# Patient Record
Sex: Female | Born: 1955 | Race: White | Hispanic: No | Marital: Married | Smoking: Never smoker
Health system: Southern US, Community
[De-identification: ages and names within clinical notes are randomized; demographics above are authoritative.]

---

## 1999-10-26 ENCOUNTER — Encounter: Payer: Self-pay | Admitting: Family Medicine

## 1999-10-26 ENCOUNTER — Encounter: Admission: RE | Admit: 1999-10-26 | Discharge: 1999-10-26 | Payer: Self-pay | Admitting: Family Medicine

## 2000-12-23 ENCOUNTER — Encounter: Payer: Self-pay | Admitting: Internal Medicine

## 2000-12-23 ENCOUNTER — Encounter: Admission: RE | Admit: 2000-12-23 | Discharge: 2000-12-23 | Payer: Self-pay | Admitting: Internal Medicine

## 2002-01-15 ENCOUNTER — Encounter: Payer: Self-pay | Admitting: Internal Medicine

## 2002-01-15 ENCOUNTER — Encounter: Admission: RE | Admit: 2002-01-15 | Discharge: 2002-01-15 | Payer: Self-pay | Admitting: Internal Medicine

## 2003-04-21 ENCOUNTER — Encounter: Payer: Self-pay | Admitting: Internal Medicine

## 2003-04-21 ENCOUNTER — Encounter: Admission: RE | Admit: 2003-04-21 | Discharge: 2003-04-21 | Payer: Self-pay | Admitting: Internal Medicine

## 2005-05-09 ENCOUNTER — Other Ambulatory Visit: Admission: RE | Admit: 2005-05-09 | Discharge: 2005-05-09 | Payer: Self-pay | Admitting: Internal Medicine

## 2006-02-28 ENCOUNTER — Emergency Department (HOSPITAL_COMMUNITY): Admission: EM | Admit: 2006-02-28 | Discharge: 2006-02-28 | Payer: Self-pay | Admitting: Emergency Medicine

## 2007-07-02 ENCOUNTER — Other Ambulatory Visit: Admission: RE | Admit: 2007-07-02 | Discharge: 2007-07-02 | Payer: Self-pay | Admitting: Obstetrics and Gynecology

## 2007-12-10 ENCOUNTER — Encounter: Admission: RE | Admit: 2007-12-10 | Discharge: 2007-12-10 | Payer: Self-pay | Admitting: Obstetrics and Gynecology

## 2009-02-23 ENCOUNTER — Encounter: Admission: RE | Admit: 2009-02-23 | Discharge: 2009-02-23 | Payer: Self-pay | Admitting: Obstetrics and Gynecology

## 2009-02-23 ENCOUNTER — Other Ambulatory Visit: Admission: RE | Admit: 2009-02-23 | Discharge: 2009-02-23 | Payer: Self-pay | Admitting: Obstetrics and Gynecology

## 2010-07-18 ENCOUNTER — Encounter: Admission: RE | Admit: 2010-07-18 | Discharge: 2010-07-18 | Payer: Self-pay | Admitting: Internal Medicine

## 2011-08-07 ENCOUNTER — Other Ambulatory Visit: Payer: Self-pay | Admitting: Internal Medicine

## 2011-08-07 DIAGNOSIS — Z1231 Encounter for screening mammogram for malignant neoplasm of breast: Secondary | ICD-10-CM

## 2011-08-28 ENCOUNTER — Ambulatory Visit: Payer: Self-pay

## 2011-09-04 ENCOUNTER — Ambulatory Visit: Payer: Self-pay

## 2011-11-20 ENCOUNTER — Other Ambulatory Visit (HOSPITAL_COMMUNITY)
Admission: RE | Admit: 2011-11-20 | Discharge: 2011-11-20 | Disposition: A | Discharge status: Home / Self Care | Payer: BC Managed Care – PPO | Source: Ambulatory Visit | Attending: Obstetrics and Gynecology | Admitting: Obstetrics and Gynecology

## 2011-11-20 ENCOUNTER — Ambulatory Visit
Admission: RE | Admit: 2011-11-20 | Discharge: 2011-11-20 | Disposition: A | Discharge status: Home / Self Care | Payer: BC Managed Care – PPO | Source: Ambulatory Visit | Attending: Internal Medicine | Admitting: Internal Medicine

## 2011-11-20 ENCOUNTER — Other Ambulatory Visit: Payer: Self-pay | Admitting: Obstetrics and Gynecology

## 2011-11-20 DIAGNOSIS — Z1231 Encounter for screening mammogram for malignant neoplasm of breast: Secondary | ICD-10-CM

## 2011-11-20 DIAGNOSIS — Z1159 Encounter for screening for other viral diseases: Secondary | ICD-10-CM | POA: Insufficient documentation

## 2011-11-20 DIAGNOSIS — Z01419 Encounter for gynecological examination (general) (routine) without abnormal findings: Secondary | ICD-10-CM | POA: Insufficient documentation

## 2014-12-13 ENCOUNTER — Other Ambulatory Visit: Payer: Self-pay | Admitting: Internal Medicine

## 2014-12-13 ENCOUNTER — Other Ambulatory Visit: Payer: Self-pay

## 2014-12-13 ENCOUNTER — Ambulatory Visit
Admission: RE | Admit: 2014-12-13 | Discharge: 2014-12-13 | Disposition: A | Discharge status: Home / Self Care | Payer: Self-pay | Source: Ambulatory Visit | Attending: Internal Medicine | Admitting: Internal Medicine

## 2014-12-13 DIAGNOSIS — M25511 Pain in right shoulder: Secondary | ICD-10-CM

## 2014-12-13 DIAGNOSIS — Z1231 Encounter for screening mammogram for malignant neoplasm of breast: Secondary | ICD-10-CM

## 2014-12-15 ENCOUNTER — Ambulatory Visit: Payer: Self-pay

## 2015-01-09 ENCOUNTER — Ambulatory Visit
Admission: RE | Admit: 2015-01-09 | Discharge: 2015-01-09 | Disposition: A | Discharge status: Home / Self Care | Payer: BLUE CROSS/BLUE SHIELD | Source: Ambulatory Visit

## 2015-01-09 ENCOUNTER — Other Ambulatory Visit (HOSPITAL_COMMUNITY)
Admission: RE | Admit: 2015-01-09 | Discharge: 2015-01-09 | Disposition: A | Discharge status: Home / Self Care | Payer: BLUE CROSS/BLUE SHIELD | Source: Ambulatory Visit | Attending: Obstetrics and Gynecology | Admitting: Obstetrics and Gynecology

## 2015-01-09 ENCOUNTER — Other Ambulatory Visit: Payer: Self-pay | Admitting: Obstetrics and Gynecology

## 2015-01-09 DIAGNOSIS — Z1151 Encounter for screening for human papillomavirus (HPV): Secondary | ICD-10-CM | POA: Insufficient documentation

## 2015-01-09 DIAGNOSIS — Z01419 Encounter for gynecological examination (general) (routine) without abnormal findings: Secondary | ICD-10-CM | POA: Diagnosis not present

## 2015-01-09 DIAGNOSIS — Z1231 Encounter for screening mammogram for malignant neoplasm of breast: Secondary | ICD-10-CM

## 2015-01-10 LAB — CYTOLOGY - PAP

## 2016-02-20 ENCOUNTER — Other Ambulatory Visit: Payer: Self-pay | Admitting: Internal Medicine

## 2016-02-20 DIAGNOSIS — M5416 Radiculopathy, lumbar region: Secondary | ICD-10-CM

## 2016-02-20 DIAGNOSIS — M545 Low back pain: Secondary | ICD-10-CM

## 2016-02-23 ENCOUNTER — Ambulatory Visit
Admission: RE | Admit: 2016-02-23 | Discharge: 2016-02-23 | Disposition: A | Discharge status: Home / Self Care | Payer: BLUE CROSS/BLUE SHIELD | Source: Ambulatory Visit | Attending: Internal Medicine | Admitting: Internal Medicine

## 2016-02-23 DIAGNOSIS — M5416 Radiculopathy, lumbar region: Secondary | ICD-10-CM

## 2016-02-23 DIAGNOSIS — M545 Low back pain: Secondary | ICD-10-CM

## 2016-10-18 IMAGING — CR DG SHOULDER 2+V*R*
3 series · 3 of 3 positions shown · non-contrast
Comparison: None.

CLINICAL DATA: Right shoulder pain for 4 months

EXAM:
RIGHT SHOULDER - 2+ VIEW

[w shoulder ap internal righ]
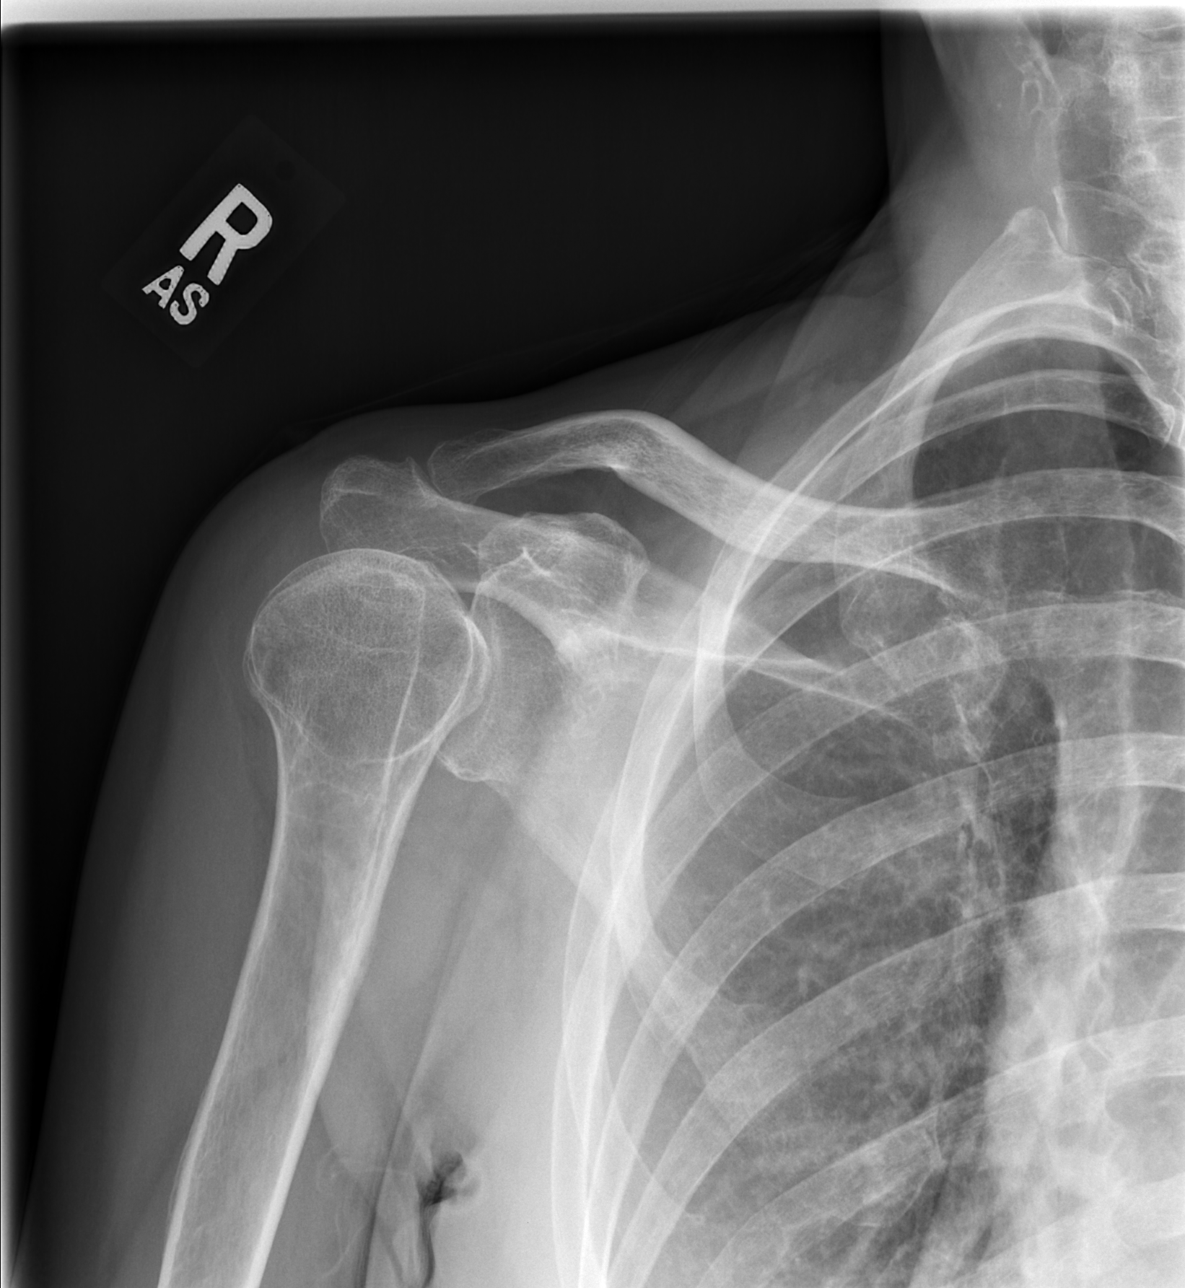

[w shoulder y view right]
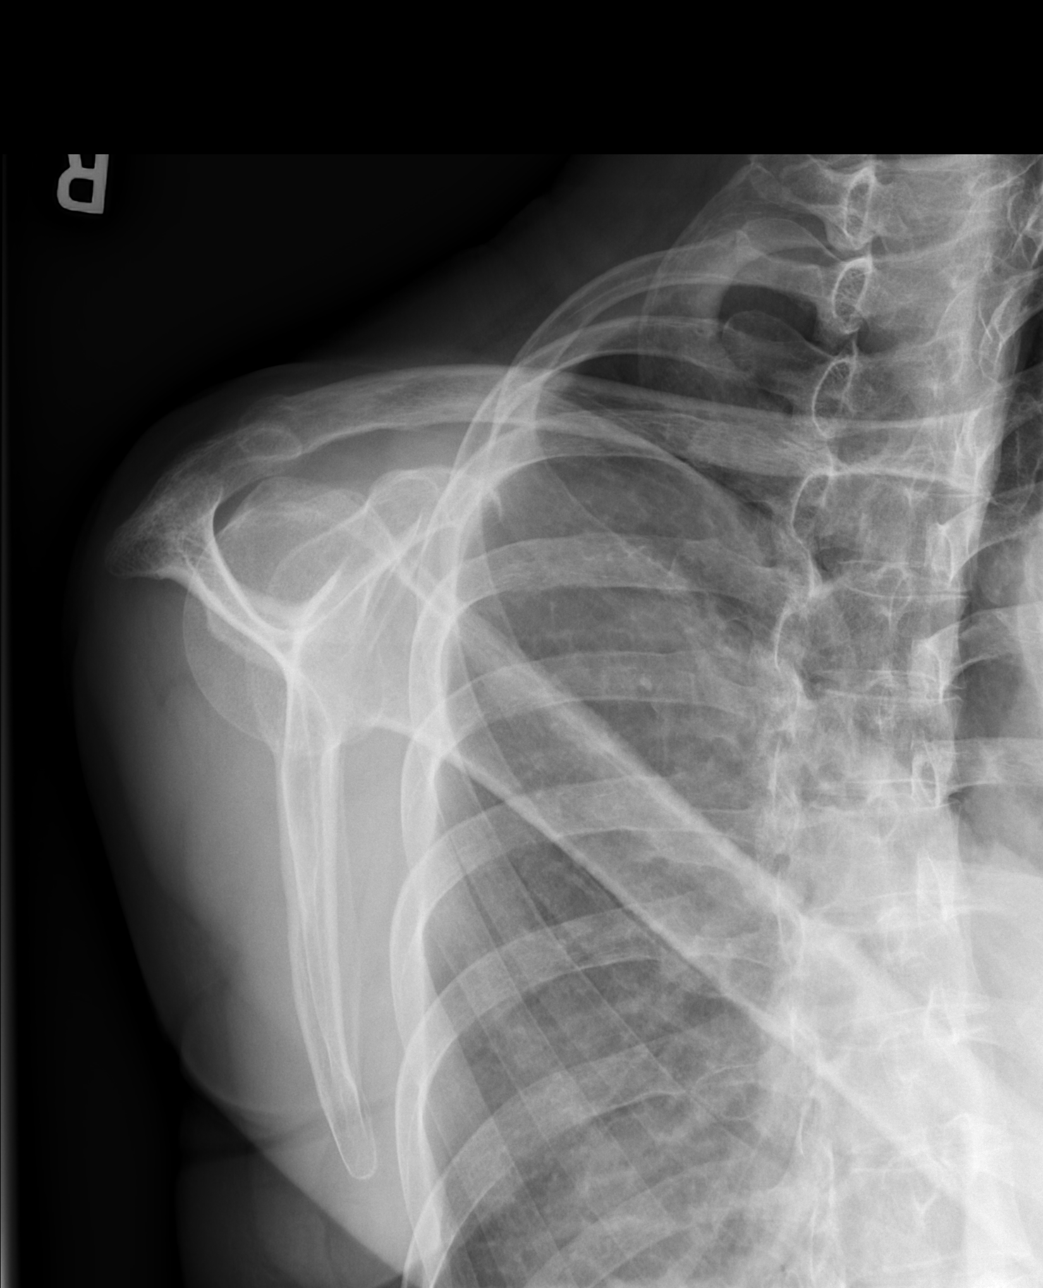

[w shoulder axillary right *]
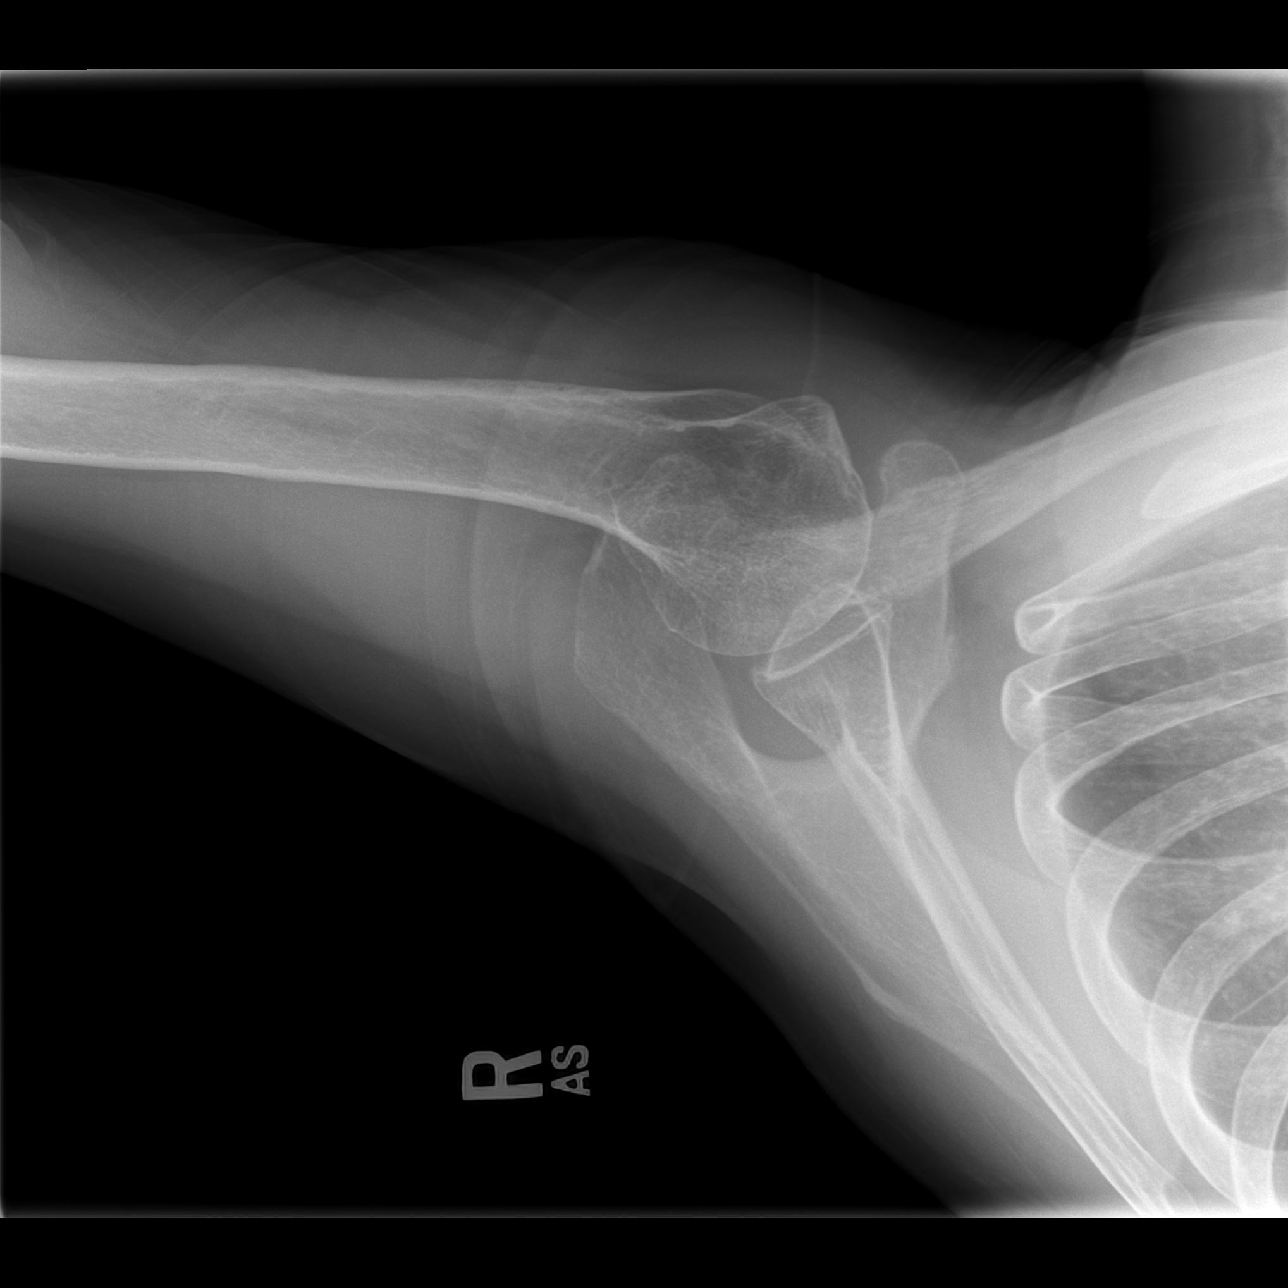

[3 of 3 positions shown; findings below may reference images not displayed]

FINDINGS: Three views of the right shoulder submitted. Mild degenerative
changes acromioclavicular joint. Glenohumeral joint is preserved. No
pathologic calcifications are noted. No acute fracture or
subluxation.
IMPRESSION: No acute fracture or subluxation. Mild degenerative changes
acromioclavicular joint.

## 2016-11-01 ENCOUNTER — Other Ambulatory Visit: Payer: Self-pay | Admitting: Obstetrics and Gynecology

## 2016-11-01 DIAGNOSIS — Z1231 Encounter for screening mammogram for malignant neoplasm of breast: Secondary | ICD-10-CM

## 2016-12-09 ENCOUNTER — Ambulatory Visit
Admission: RE | Admit: 2016-12-09 | Discharge: 2016-12-09 | Disposition: A | Discharge status: Home / Self Care | Payer: BLUE CROSS/BLUE SHIELD | Source: Ambulatory Visit | Attending: Obstetrics and Gynecology | Admitting: Obstetrics and Gynecology

## 2016-12-09 ENCOUNTER — Encounter: Payer: Self-pay | Admitting: Radiology

## 2016-12-09 DIAGNOSIS — Z1231 Encounter for screening mammogram for malignant neoplasm of breast: Secondary | ICD-10-CM

## 2017-07-08 ENCOUNTER — Other Ambulatory Visit: Payer: Self-pay | Admitting: Gastroenterology

## 2017-07-08 DIAGNOSIS — R1084 Generalized abdominal pain: Secondary | ICD-10-CM

## 2017-07-10 ENCOUNTER — Ambulatory Visit
Admission: RE | Admit: 2017-07-10 | Discharge: 2017-07-10 | Disposition: A | Discharge status: Home / Self Care | Payer: BLUE CROSS/BLUE SHIELD | Source: Ambulatory Visit | Attending: Gastroenterology | Admitting: Gastroenterology

## 2017-07-10 DIAGNOSIS — R1084 Generalized abdominal pain: Secondary | ICD-10-CM

## 2017-07-10 MED ORDER — IOPAMIDOL (ISOVUE-300) INJECTION 61%
100.0000 mL | Freq: Once | INTRAVENOUS | Status: AC | PRN
  Administered 2017-07-10: 100 mL via INTRAVENOUS

## 2017-07-28 ENCOUNTER — Other Ambulatory Visit: Payer: Self-pay | Admitting: Internal Medicine

## 2017-07-28 ENCOUNTER — Ambulatory Visit
Admission: RE | Admit: 2017-07-28 | Discharge: 2017-07-28 | Disposition: A | Discharge status: Home / Self Care | Payer: BLUE CROSS/BLUE SHIELD | Source: Ambulatory Visit | Attending: Internal Medicine | Admitting: Internal Medicine

## 2017-07-28 DIAGNOSIS — R519 Headache, unspecified: Secondary | ICD-10-CM

## 2017-07-28 DIAGNOSIS — G8929 Other chronic pain: Secondary | ICD-10-CM

## 2017-07-28 DIAGNOSIS — R51 Headache: Principal | ICD-10-CM

## 2018-01-07 ENCOUNTER — Other Ambulatory Visit: Payer: Self-pay | Admitting: Obstetrics and Gynecology

## 2018-01-07 ENCOUNTER — Ambulatory Visit
Admission: RE | Admit: 2018-01-07 | Discharge: 2018-01-07 | Disposition: A | Discharge status: Home / Self Care | Payer: BLUE CROSS/BLUE SHIELD | Source: Ambulatory Visit | Attending: Obstetrics and Gynecology | Admitting: Obstetrics and Gynecology

## 2018-01-07 ENCOUNTER — Other Ambulatory Visit (HOSPITAL_COMMUNITY)
Admission: RE | Admit: 2018-01-07 | Discharge: 2018-01-07 | Disposition: A | Discharge status: Home / Self Care | Payer: BLUE CROSS/BLUE SHIELD | Source: Ambulatory Visit | Attending: Obstetrics and Gynecology | Admitting: Obstetrics and Gynecology

## 2018-01-07 DIAGNOSIS — Z1231 Encounter for screening mammogram for malignant neoplasm of breast: Secondary | ICD-10-CM

## 2018-01-07 DIAGNOSIS — Z01411 Encounter for gynecological examination (general) (routine) with abnormal findings: Secondary | ICD-10-CM | POA: Diagnosis not present

## 2018-01-08 LAB — CYTOLOGY - PAP
Diagnosis: NEGATIVE
HPV (WINDOPATH): NOT DETECTED

## 2018-09-18 ENCOUNTER — Encounter: Payer: Self-pay | Admitting: Plastic Surgery

## 2018-09-18 ENCOUNTER — Ambulatory Visit: Payer: BLUE CROSS/BLUE SHIELD | Admitting: Plastic Surgery

## 2018-09-18 DIAGNOSIS — L989 Disorder of the skin and subcutaneous tissue, unspecified: Secondary | ICD-10-CM

## 2018-09-18 NOTE — Progress Notes (Signed)
     Patient ID: Suzanne Floyd, female    DOB: 01-14-1956, 62 y.o.   MRN: [id number]   Chief Complaint  Patient presents with  . Advice Only    discuss mole on (L) side of nose    The patient is a 62 year old white female here for evaluation of a changing skin lesion on her nose.  She has had the lesion excised in the past and it has come back.  It is sometimes tender to touch.  It seems to be getting larger with time.  There is a little bit of asymmetry and hyperpigmentation in the middle.  It is 1 cm in size and raised.  The surrounding skin is hypopigmented.  She has had skin lesions removed in the past that were concerning but she has not had skin cancer.  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  She is otherwise in good health.   Review of Systems  Constitutional: Negative.   HENT: Negative.   Eyes: Negative.   Respiratory: Negative.   Cardiovascular: Negative.   Gastrointestinal: Negative.   Endocrine: Negative.   Genitourinary: Negative.   Musculoskeletal: Negative.   Skin: Negative.  Negative for color change and wound.  Neurological: Negative.   Hematological: Negative.   Psychiatric/Behavioral: Negative.     History reviewed. No pertinent past medical history.  History reviewed. No pertinent surgical history.    Current Outpatient Medications:  .  Lurasidone HCl (LATUDA) 60 MG TABS, Take 1 tablet by mouth daily., Disp: , Rfl:    Objective:   Vitals:   09/18/18 1108  BP: 102/60  Pulse: 65  SpO2: 99%    Physical Exam Vitals signs reviewed.  HENT:     Head: Normocephalic.     Nose:   Neck:     Musculoskeletal: Normal range of motion.  Cardiovascular:     Rate and Rhythm: Normal rate.  Pulmonary:     Effort: Pulmonary effort is normal.  Abdominal:     General: Abdomen is flat. There is no distension.  Skin:    General: Skin is warm.     Coloration: Skin is not jaundiced.  Neurological:     General: No focal deficit present.    Psychiatric:        Mood and Affect: Mood normal.        Thought Content: Thought content normal.        Judgment: Judgment normal.     Assessment & Plan:  Changing skin lesion Recommend excision of changing skin lesion of her nose.  There will be a scar although minimal and we did discuss this.  She would like to proceed with excision and path evaluation.  Alena Billslaire S Dillingham, DO

## 2018-09-25 ENCOUNTER — Institutional Professional Consult (permissible substitution): Payer: BLUE CROSS/BLUE SHIELD | Admitting: Plastic Surgery

## 2018-11-04 ENCOUNTER — Other Ambulatory Visit: Payer: Self-pay | Admitting: Internal Medicine

## 2018-11-04 ENCOUNTER — Ambulatory Visit
Admission: RE | Admit: 2018-11-04 | Discharge: 2018-11-04 | Disposition: A | Discharge status: Home / Self Care | Payer: BLUE CROSS/BLUE SHIELD | Source: Ambulatory Visit | Attending: Internal Medicine | Admitting: Internal Medicine

## 2018-11-04 DIAGNOSIS — Z1231 Encounter for screening mammogram for malignant neoplasm of breast: Secondary | ICD-10-CM

## 2018-11-04 DIAGNOSIS — Z0000 Encounter for general adult medical examination without abnormal findings: Secondary | ICD-10-CM

## 2018-11-04 DIAGNOSIS — M810 Age-related osteoporosis without current pathological fracture: Secondary | ICD-10-CM

## 2018-11-05 ENCOUNTER — Other Ambulatory Visit (HOSPITAL_COMMUNITY): Payer: Self-pay | Admitting: Respiratory Therapy

## 2018-11-05 DIAGNOSIS — R0602 Shortness of breath: Secondary | ICD-10-CM

## 2018-11-18 ENCOUNTER — Encounter (HOSPITAL_COMMUNITY): Payer: BLUE CROSS/BLUE SHIELD

## 2018-11-20 ENCOUNTER — Ambulatory Visit (HOSPITAL_COMMUNITY)
Admission: RE | Admit: 2018-11-20 | Discharge: 2018-11-20 | Disposition: A | Discharge status: Home / Self Care | Payer: BLUE CROSS/BLUE SHIELD | Source: Ambulatory Visit | Attending: Internal Medicine | Admitting: Internal Medicine

## 2018-11-20 DIAGNOSIS — R0602 Shortness of breath: Secondary | ICD-10-CM | POA: Insufficient documentation

## 2018-11-20 LAB — PULMONARY FUNCTION TEST
DL/VA % pred: 127 %
DL/VA: 5.23 ml/min/mmHg/L
DLCO UNC % PRED: 104 %
DLCO UNC: 22.98 ml/min/mmHg
FEF 25-75 POST: 3.27 L/s
FEF 25-75 PRE: 3.46 L/s
FEF2575-%Change-Post: -5 %
FEF2575-%PRED-POST: 139 %
FEF2575-%Pred-Pre: 147 %
FEV1-%Change-Post: 1 %
FEV1-%PRED-PRE: 92 %
FEV1-%Pred-Post: 93 %
FEV1-POST: 2.56 L
FEV1-Pre: 2.53 L
FEV1FVC-%Change-Post: -3 %
FEV1FVC-%PRED-PRE: 110 %
FEV6-%Change-Post: 8 %
FEV6-%PRED-PRE: 83 %
FEV6-%Pred-Post: 90 %
FEV6-POST: 3.1 L
FEV6-Pre: 2.86 L
FEV6FVC-%Pred-Post: 103 %
FEV6FVC-%Pred-Pre: 103 %
FVC-%Change-Post: 5 %
FVC-%PRED-PRE: 83 %
FVC-%Pred-Post: 87 %
FVC-POST: 3.12 L
FVC-PRE: 2.96 L
POST FEV6/FVC RATIO: 100 %
PRE FEV1/FVC RATIO: 85 %
PRE FEV6/FVC RATIO: 100 %
Post FEV1/FVC ratio: 82 %
RV % PRED: 123 %
RV: 2.73 L
TLC % PRED: 98 %
TLC: 5.43 L

## 2018-11-20 MED ORDER — ALBUTEROL SULFATE (2.5 MG/3ML) 0.083% IN NEBU
2.5000 mg | INHALATION_SOLUTION | Freq: Once | RESPIRATORY_TRACT | Status: AC
  Administered 2018-11-20: 2.5 mg via RESPIRATORY_TRACT

## 2019-01-13 ENCOUNTER — Other Ambulatory Visit: Payer: BLUE CROSS/BLUE SHIELD

## 2019-01-13 ENCOUNTER — Ambulatory Visit: Payer: BLUE CROSS/BLUE SHIELD

## 2019-03-23 ENCOUNTER — Other Ambulatory Visit: Payer: BLUE CROSS/BLUE SHIELD

## 2019-03-23 ENCOUNTER — Ambulatory Visit: Payer: BLUE CROSS/BLUE SHIELD

## 2019-11-11 ENCOUNTER — Other Ambulatory Visit: Payer: Self-pay | Admitting: Internal Medicine

## 2019-11-11 DIAGNOSIS — M810 Age-related osteoporosis without current pathological fracture: Secondary | ICD-10-CM

## 2019-11-11 DIAGNOSIS — Z1231 Encounter for screening mammogram for malignant neoplasm of breast: Secondary | ICD-10-CM

## 2020-01-31 ENCOUNTER — Ambulatory Visit
Admission: RE | Admit: 2020-01-31 | Discharge: 2020-01-31 | Disposition: A | Discharge status: Home / Self Care | Payer: BC Managed Care – PPO | Source: Ambulatory Visit | Attending: Internal Medicine | Admitting: Internal Medicine

## 2020-01-31 ENCOUNTER — Other Ambulatory Visit: Payer: Self-pay

## 2020-01-31 DIAGNOSIS — Z1231 Encounter for screening mammogram for malignant neoplasm of breast: Secondary | ICD-10-CM

## 2020-01-31 DIAGNOSIS — M810 Age-related osteoporosis without current pathological fracture: Secondary | ICD-10-CM

## 2020-03-20 ENCOUNTER — Ambulatory Visit
Admission: RE | Admit: 2020-03-20 | Discharge: 2020-03-20 | Disposition: A | Discharge status: Home / Self Care | Payer: Medicare Other | Source: Ambulatory Visit | Attending: Physician Assistant | Admitting: Physician Assistant

## 2020-03-20 ENCOUNTER — Other Ambulatory Visit: Payer: Self-pay | Admitting: Physician Assistant

## 2020-03-20 DIAGNOSIS — M79645 Pain in left finger(s): Secondary | ICD-10-CM

## 2021-01-17 ENCOUNTER — Other Ambulatory Visit: Payer: Self-pay | Admitting: Internal Medicine

## 2021-01-17 DIAGNOSIS — Z1231 Encounter for screening mammogram for malignant neoplasm of breast: Secondary | ICD-10-CM

## 2021-03-08 ENCOUNTER — Ambulatory Visit
Admission: RE | Admit: 2021-03-08 | Discharge: 2021-03-08 | Disposition: A | Discharge status: Home / Self Care | Payer: Medicare Other | Source: Ambulatory Visit | Attending: Internal Medicine | Admitting: Internal Medicine

## 2021-03-08 ENCOUNTER — Other Ambulatory Visit: Payer: Self-pay

## 2021-03-08 DIAGNOSIS — Z1231 Encounter for screening mammogram for malignant neoplasm of breast: Secondary | ICD-10-CM

## 2021-11-22 ENCOUNTER — Other Ambulatory Visit: Payer: Self-pay | Admitting: Obstetrics and Gynecology

## 2021-11-22 DIAGNOSIS — M8580 Other specified disorders of bone density and structure, unspecified site: Secondary | ICD-10-CM

## 2021-11-28 ENCOUNTER — Other Ambulatory Visit: Payer: Self-pay | Admitting: Obstetrics and Gynecology

## 2021-11-28 DIAGNOSIS — Z1231 Encounter for screening mammogram for malignant neoplasm of breast: Secondary | ICD-10-CM

## 2022-02-28 ENCOUNTER — Other Ambulatory Visit (HOSPITAL_COMMUNITY): Payer: Self-pay | Admitting: Internal Medicine

## 2022-02-28 ENCOUNTER — Ambulatory Visit (HOSPITAL_COMMUNITY)
Admission: RE | Admit: 2022-02-28 | Discharge: 2022-02-28 | Disposition: A | Discharge status: Home / Self Care | Payer: Medicare Other | Source: Ambulatory Visit | Attending: Internal Medicine | Admitting: Internal Medicine

## 2022-02-28 DIAGNOSIS — R609 Edema, unspecified: Secondary | ICD-10-CM

## 2022-02-28 DIAGNOSIS — M79604 Pain in right leg: Secondary | ICD-10-CM | POA: Diagnosis not present

## 2022-02-28 DIAGNOSIS — M7989 Other specified soft tissue disorders: Secondary | ICD-10-CM

## 2022-02-28 NOTE — Progress Notes (Signed)
VASCULAR LAB    Right lower extremity venous duplex has been performed.  See CV proc for preliminary results.   Left results on Dr. Venita Sheffield VM  Suzanne Floyd, Suzanne Floyd, RVT 02/28/2022, 3:36 PM

## 2022-04-23 ENCOUNTER — Ambulatory Visit
Admission: RE | Admit: 2022-04-23 | Discharge: 2022-04-23 | Disposition: A | Discharge status: Home / Self Care | Payer: Medicare Other | Source: Ambulatory Visit | Attending: Obstetrics and Gynecology | Admitting: Obstetrics and Gynecology

## 2022-04-23 DIAGNOSIS — M8580 Other specified disorders of bone density and structure, unspecified site: Secondary | ICD-10-CM

## 2022-04-23 DIAGNOSIS — Z1231 Encounter for screening mammogram for malignant neoplasm of breast: Secondary | ICD-10-CM

## 2023-01-12 IMAGING — MG MM DIGITAL SCREENING BILAT W/ TOMO AND CAD
8 series · 9 of 24 positions shown · non-contrast
Comparison: Previous exam(s).

CLINICAL DATA: Screening.

EXAM:
DIGITAL SCREENING BILATERAL MAMMOGRAM WITH TOMOSYNTHESIS AND CAD
TECHNIQUE: Bilateral screening digital craniocaudal and mediolateral oblique
mammograms were obtained. Bilateral screening digital breast
tomosynthesis was performed. The images were evaluated with
computer-aided detection.

[L CC synth-2D]
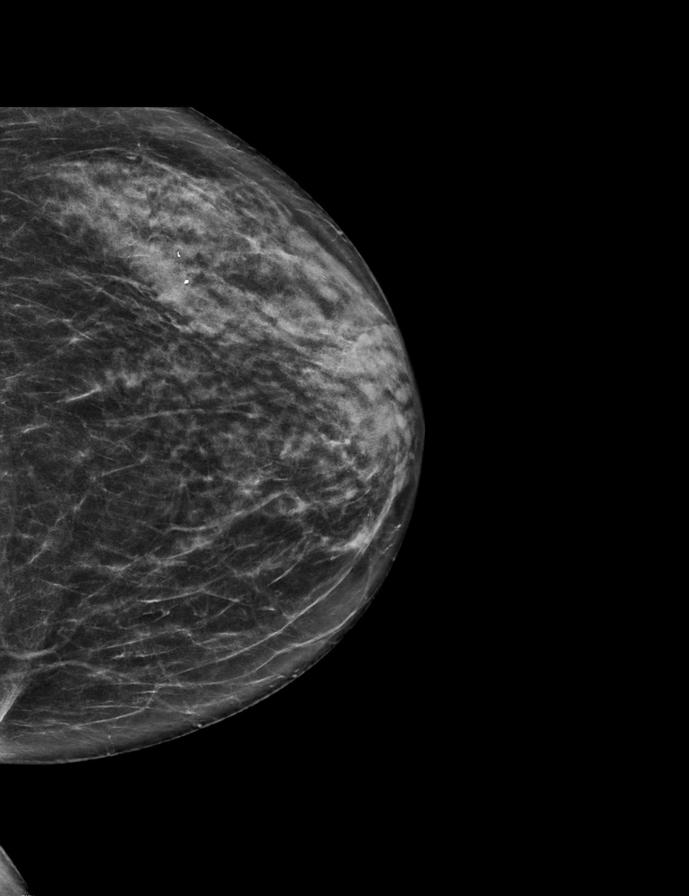

[R CC synth-2D]
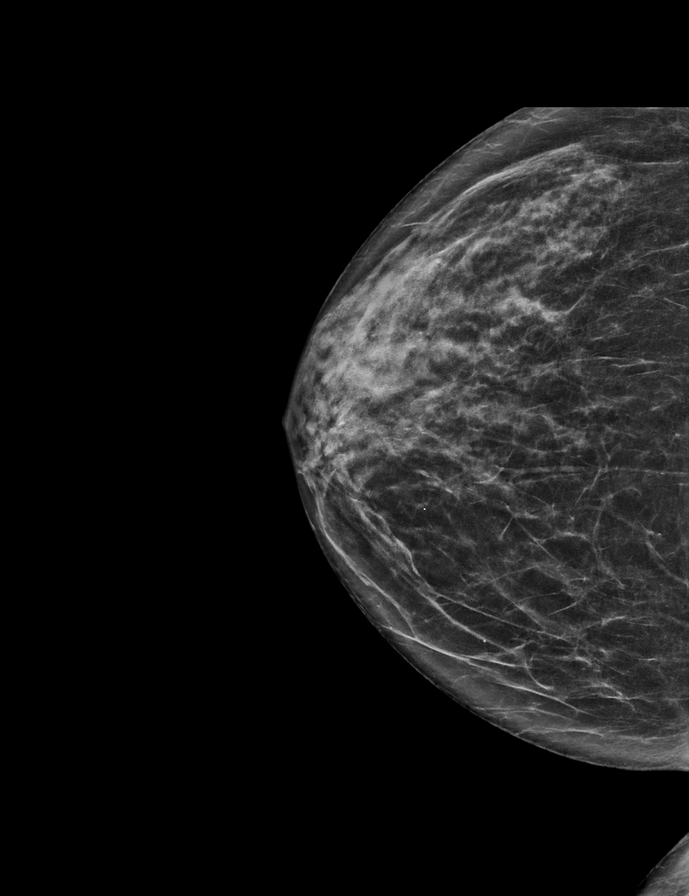

[L MLO synth-2D]
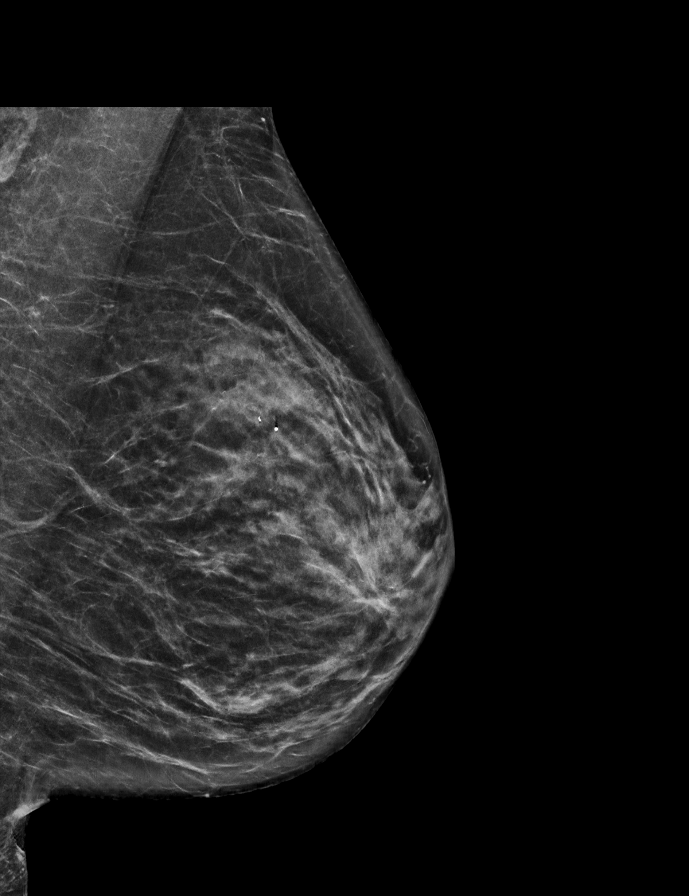

[R MLO synth-2D]
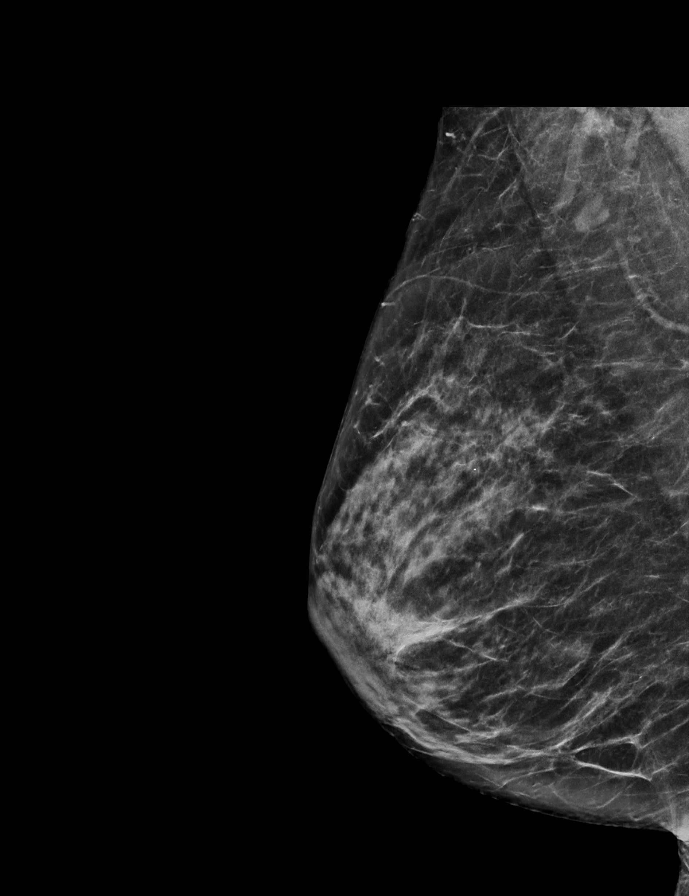

[L MLO tomo · 2 of 62 frames shown]
[frame 21/62]
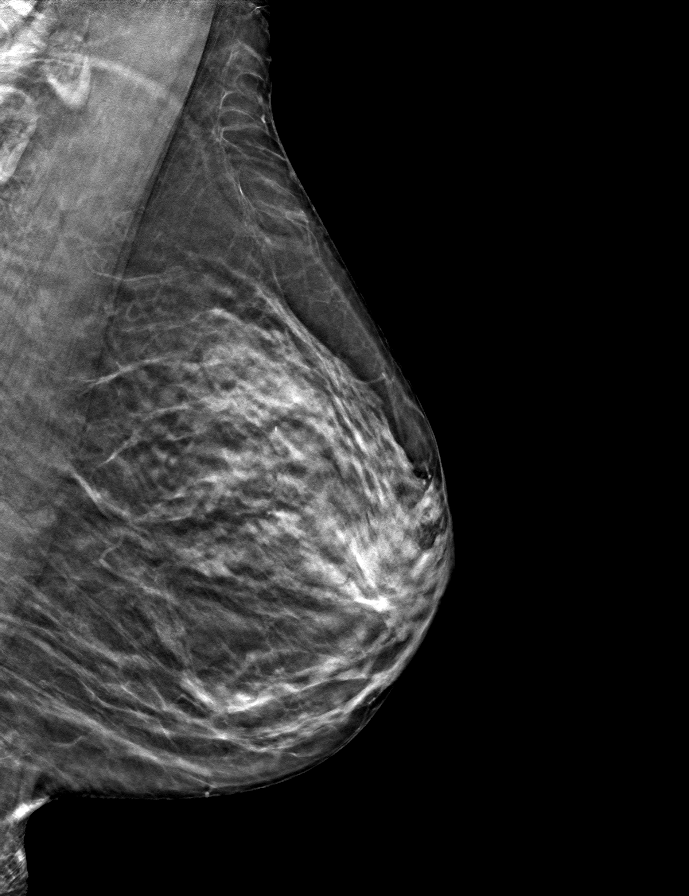
[frame 31/62]
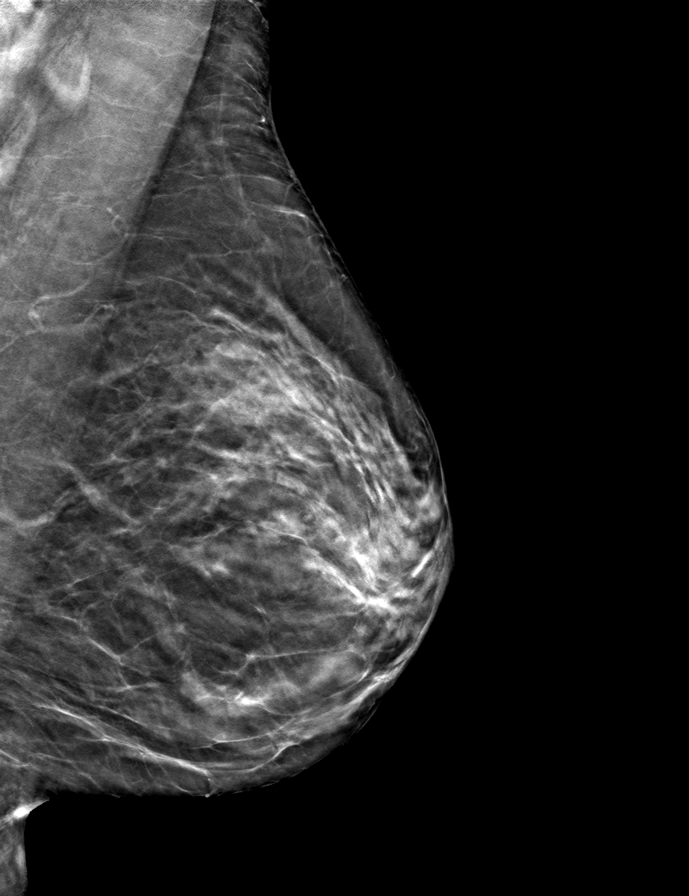

[R CC tomo · tomo slice 30/59.0]
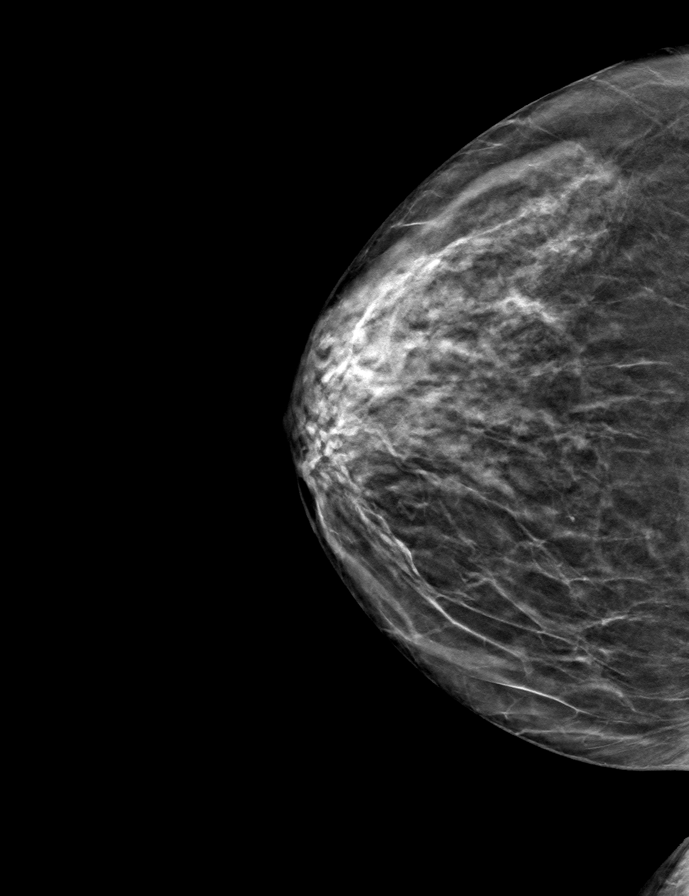

[L CC tomo · tomo slice 30/59.0]
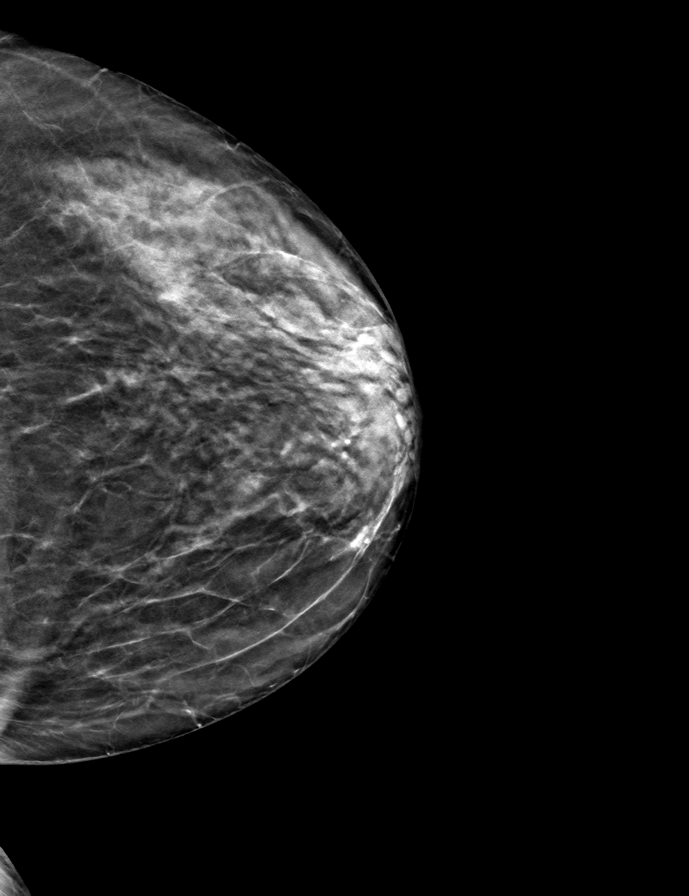

[R MLO tomo · tomo slice 33/64.0]
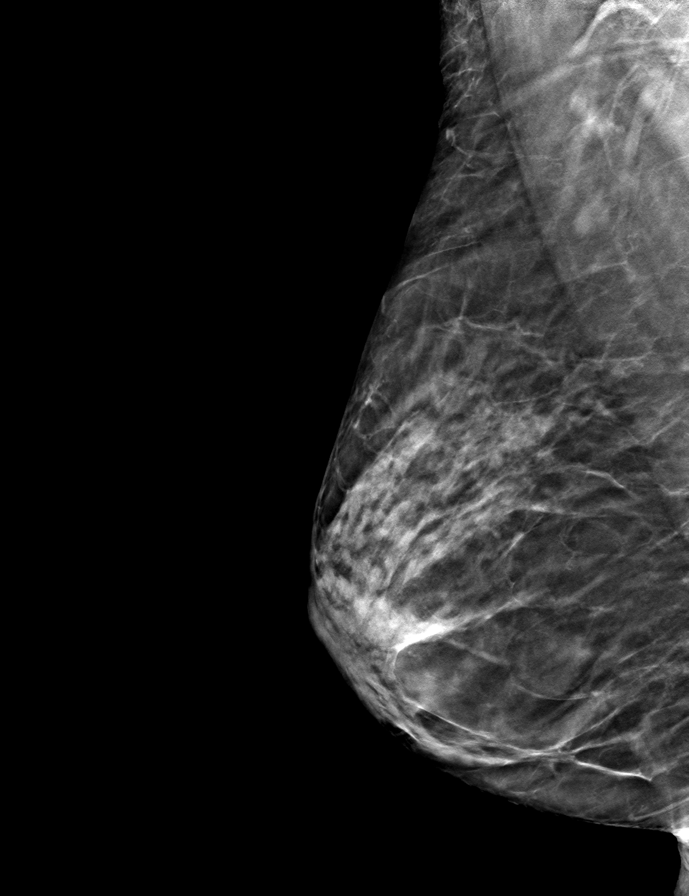

[9 of 24 positions shown; findings below may reference images not displayed]

ACR Breast Density Category c: The breast tissue is heterogeneously
dense, which may obscure small masses.
FINDINGS: There are no findings suspicious for malignancy. The images were
evaluated with computer-aided detection.
IMPRESSION: No mammographic evidence of malignancy. A result letter of this
screening mammogram will be mailed directly to the patient.

RECOMMENDATION:
Screening mammogram in one year. (Code:T4-5-GWO)

BI-RADS CATEGORY  1: Negative.

## 2023-07-21 ENCOUNTER — Ambulatory Visit
Admission: RE | Admit: 2023-07-21 | Discharge: 2023-07-21 | Disposition: A | Discharge status: Home / Self Care | Payer: Medicare Other | Source: Ambulatory Visit | Attending: Internal Medicine | Admitting: Internal Medicine

## 2023-07-21 ENCOUNTER — Other Ambulatory Visit: Payer: Self-pay | Admitting: Internal Medicine

## 2023-07-21 DIAGNOSIS — S6991XA Unspecified injury of right wrist, hand and finger(s), initial encounter: Secondary | ICD-10-CM
